# Patient Record
Sex: Female | Born: 1950
Health system: Southern US, Community
[De-identification: ages and names within clinical notes are randomized; demographics above are authoritative.]

## PROBLEM LIST (undated history)

## (undated) DIAGNOSIS — E785 Hyperlipidemia, unspecified: Secondary | ICD-10-CM

## (undated) HISTORY — PX: BUNIONECTOMY: SHX129

## (undated) HISTORY — PX: HAMMER TOE SURGERY: SHX385

## (undated) HISTORY — PX: OTHER SURGICAL HISTORY: SHX169

## (undated) HISTORY — PX: TUBAL LIGATION: SHX77

## (undated) HISTORY — DX: Hyperlipidemia, unspecified: E78.5

---

## 1998-10-26 ENCOUNTER — Other Ambulatory Visit: Admission: RE | Admit: 1998-10-26 | Discharge: 1998-10-26 | Payer: Self-pay | Admitting: Obstetrics and Gynecology

## 1999-12-03 ENCOUNTER — Other Ambulatory Visit: Admission: RE | Admit: 1999-12-03 | Discharge: 1999-12-03 | Payer: Self-pay | Admitting: Obstetrics and Gynecology

## 2001-05-06 ENCOUNTER — Other Ambulatory Visit: Admission: RE | Admit: 2001-05-06 | Discharge: 2001-05-06 | Payer: Self-pay | Admitting: Obstetrics and Gynecology

## 2002-05-17 ENCOUNTER — Other Ambulatory Visit: Admission: RE | Admit: 2002-05-17 | Discharge: 2002-05-17 | Payer: Self-pay | Admitting: Obstetrics and Gynecology

## 2003-07-31 ENCOUNTER — Other Ambulatory Visit: Admission: RE | Admit: 2003-07-31 | Discharge: 2003-07-31 | Payer: Self-pay | Admitting: Obstetrics and Gynecology

## 2004-06-28 ENCOUNTER — Ambulatory Visit (HOSPITAL_COMMUNITY): Admission: RE | Admit: 2004-06-28 | Discharge: 2004-06-28 | Payer: Self-pay | Admitting: Gastroenterology

## 2016-09-01 ENCOUNTER — Ambulatory Visit (HOSPITAL_COMMUNITY)
Admission: EM | Admit: 2016-09-01 | Discharge: 2016-09-01 | Disposition: A | Discharge status: Home / Self Care | Payer: 59 | Attending: Family Medicine | Admitting: Family Medicine

## 2016-09-01 ENCOUNTER — Encounter (HOSPITAL_COMMUNITY): Payer: Self-pay | Admitting: *Deleted

## 2016-09-01 ENCOUNTER — Ambulatory Visit (INDEPENDENT_AMBULATORY_CARE_PROVIDER_SITE_OTHER): Payer: 59

## 2016-09-01 DIAGNOSIS — M25461 Effusion, right knee: Secondary | ICD-10-CM | POA: Insufficient documentation

## 2016-09-01 DIAGNOSIS — M25561 Pain in right knee: Secondary | ICD-10-CM | POA: Diagnosis not present

## 2016-09-01 LAB — SYNOVIAL CELL COUNT + DIFF, W/ CRYSTALS
CRYSTALS FLUID: NONE SEEN
Eosinophils-Synovial: 0 % (ref 0–1)
LYMPHOCYTES-SYNOVIAL FLD: 32 % — AB (ref 0–20)
Monocyte-Macrophage-Synovial Fluid: 43 % — ABNORMAL LOW (ref 50–90)
Neutrophil, Synovial: 25 % (ref 0–25)
WBC, Synovial: 66 /mm3 (ref 0–200)

## 2016-09-01 MED ORDER — TRIAMCINOLONE ACETONIDE 40 MG/ML IJ SUSP
INTRAMUSCULAR | Status: AC
  Filled 2016-09-01: qty 1

## 2016-09-01 MED ORDER — DICLOFENAC SODIUM 75 MG PO TBEC: 75.0000 mg | DELAYED_RELEASE_TABLET | Freq: Two times a day (BID) | ORAL | 0 refills | Status: DC

## 2016-09-01 NOTE — ED Triage Notes (Signed)
Reports kneeling onto a vehicle running board with right knee approx 3 wks ago.  C/O continued pain to right knee.  C/O worsening pain when trying to straighten knee.  Has been wearing right knee brace.  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

## 2016-09-01 NOTE — ED Provider Notes (Signed)
CSN: [id number]     Arrival date & time 09/01/16  1338 History   None    Chief Complaint  Patient presents with  . Knee Pain   (Consider location/radiation/quality/duration/timing/severity/associated sxs/prior Treatment) The history is provided by the patient.  Knee Pain  Location:  Knee Time since incident:  3 weeks Injury: yes   Mechanism of injury: fall   Fall:    Fall occurred: standing position, kneeling down onto the running board of a vehicle.   Impact surface: steel running board.   Point of impact:  Knees   Entrapped after fall: no   Knee location:  R knee Pain details:    Quality:  Aching, dull, pressure and throbbing   Radiates to:  Does not radiate   Severity:  Moderate   Onset quality:  Gradual   Duration:  3 weeks   Timing:  Constant   Progression:  Waxing and waning Chronicity:  New Dislocation: no   Prior injury to area:  No Relieved by:  Compression, immobilization and NSAIDs Worsened by:  Extension, rotation and bearing weight Associated symptoms: decreased ROM, stiffness and swelling   Associated symptoms: no back pain, no fatigue, no fever, no muscle weakness, no neck pain and no tingling   Risk factors: no known bone disorder     History reviewed. No pertinent past medical history. Past Surgical History:  Procedure Laterality Date  . BUNIONECTOMY     x2  . HAMMER TOE SURGERY    . TUBAL LIGATION     History reviewed. No pertinent family history. Social History  Substance Use Topics  . Smoking status: Never Smoker  . Smokeless tobacco: Not on file  . Alcohol use Yes     Comment: 14 drinks/wk - either wine or gin/tonic   OB History    No data available     Review of Systems  Constitutional: Negative for chills, fatigue and fever.  Respiratory: Negative.   Cardiovascular: Negative.   Gastrointestinal: Negative.   Musculoskeletal: Positive for joint swelling and stiffness. Negative for back pain, neck pain and neck stiffness.  Skin:  Negative.   Neurological: Negative.     Allergies  Sulfa antibiotics  Home Medications   Prior to Admission medications   Medication Sig Start Date End Date Taking? Authorizing Provider  ASPIRIN PO Take 81 mg by mouth.   Yes [provider]  CALCIUM PO Take by mouth.   Yes [provider]  Cyanocobalamin (VITAMIN B-12 SL) Place under the tongue.   Yes [provider]  IBUPROFEN PO Take by mouth.   Yes [provider]  Misc Natural Products (TART CHERRY ADVANCED PO) Take by mouth.   Yes [provider]  Multiple Vitamins-Minerals (MULTIVITAMIN PO) Take by mouth.   Yes [provider]  TURMERIC PO Take by mouth.   Yes [provider]  diclofenac (VOLTAREN) 75 MG EC tablet Take 1 tablet (75 mg total) by mouth 2 (two) times daily. 09/01/16   Dorena BodoKennard, Ronnell Makarewicz, NP   Meds Ordered and Administered this Visit  Medications - No data to display  BP 119/83   Pulse (!) 58   Temp 98.4 F (36.9 C) (Oral)   Resp 16   SpO2 98%  No data found.   Physical Exam  Constitutional: She is oriented to person, place, and time. She appears well-developed and well-nourished. No distress.  HENT:  Head: Normocephalic and atraumatic.  Right Ear: External ear normal.  Left Ear: External ear normal.  Eyes: Conjunctivae are normal.  Neck: Normal range of motion.  Cardiovascular: Normal rate and regular rhythm.   Pulmonary/Chest: Effort normal and breath sounds normal.  Musculoskeletal:  Tenderness along the MCL, and noted effusion within the joint, with ballottement of the patella, and positive wave sign through the knee.  Neurological: She is alert and oriented to person, place, and time.  Skin: Skin is warm and dry. Capillary refill takes less than 2 seconds. No rash noted. She is not diaphoretic. No erythema.  Psychiatric: She has a normal mood and affect. Her behavior is normal.  Nursing note and vitals reviewed.   Urgent Care Course      .Joint Aspiration/Arthrocentesis Date/Time: 09/01/2016 4:07 PM Performed by: Dorena Bodo Authorized by: Elvina Sidle   Consent:    Consent obtained:  Verbal   Consent given by:  Patient   Risks discussed:  Bleeding, infection and pain Location:    Location:  Knee   Knee:  R knee Anesthesia (see MAR for exact dosages):    Anesthesia method:  Local infiltration   Local anesthetic:  Lidocaine 2% w/o epi Procedure details:    Needle gauge:  18 G   Ultrasound guidance: no     Approach:  Medial   Aspirate amount:  10 cc   Aspirate characteristics:  Yellow and clear   Steroid injected: yes     Specimen collected: yes   Post-procedure details:    Dressing:  Adhesive bandage   Patient tolerance of procedure:  Tolerated well, no immediate complications Comments:     Effusion drained, steroid and lidocaine injected for pain relief    (including critical care time)  Labs Review Labs Reviewed  SYNOVIAL CELL COUNT + DIFF, W/ CRYSTALS    Imaging Review Dg Knee Complete 4 Views Right  Result Date: 09/01/2016 CLINICAL DATA:  Larey Seat trying to get into car yesterday, knee hit floor board very hard, pain around RIGHT knee CT EXAM: RIGHT KNEE - COMPLETE 4+ VIEW COMPARISON:  None FINDINGS: Mild osseous demineralization. Minimal scattered joint space narrowing. Small knee joint effusion. No acute fracture, dislocation, or bone destruction. IMPRESSION: Mild degenerative changes and small knee joint effusion without acute bony abnormalities. Electronically Signed   By: Ulyses Southward M.D.   On: 09/01/2016 15:21         MDM   1. Effusion of right knee    Joint effusion drained in clinic, sample sent to lab for evaluation. Injection of lidocaine and Kenalog performed for pain relief. Counseling provided on aftercare. Follow up with primary care provider as needed.    Dorena Bodo, NP 09/01/16 1610

## 2016-09-01 NOTE — Discharge Instructions (Signed)
Fluid has been drained from your knee and an injection of lidocaine and kenalog has been placed into your knee. I recommend continued wearing of your knee brace for support, rest, ice, elevation and compression through either your brace or Ace wraps. I have also added antiinflammatories as well, take 1 tablet twice daily as needed. Follow up with your primary care provider as needed.

## 2017-11-26 DIAGNOSIS — E559 Vitamin D deficiency, unspecified: Secondary | ICD-10-CM | POA: Diagnosis not present

## 2017-12-25 DIAGNOSIS — E7800 Pure hypercholesterolemia, unspecified: Secondary | ICD-10-CM | POA: Diagnosis not present

## 2017-12-28 DIAGNOSIS — Z0100 Encounter for examination of eyes and vision without abnormal findings: Secondary | ICD-10-CM | POA: Diagnosis not present

## 2018-02-05 DIAGNOSIS — Z0000 Encounter for general adult medical examination without abnormal findings: Secondary | ICD-10-CM | POA: Diagnosis not present

## 2018-02-05 DIAGNOSIS — R9431 Abnormal electrocardiogram [ECG] [EKG]: Secondary | ICD-10-CM | POA: Diagnosis not present

## 2018-02-05 DIAGNOSIS — Z131 Encounter for screening for diabetes mellitus: Secondary | ICD-10-CM | POA: Diagnosis not present

## 2018-02-05 DIAGNOSIS — E7800 Pure hypercholesterolemia, unspecified: Secondary | ICD-10-CM | POA: Diagnosis not present

## 2018-02-05 DIAGNOSIS — E669 Obesity, unspecified: Secondary | ICD-10-CM | POA: Diagnosis not present

## 2018-02-05 DIAGNOSIS — Z23 Encounter for immunization: Secondary | ICD-10-CM | POA: Diagnosis not present

## 2018-02-05 DIAGNOSIS — Z1159 Encounter for screening for other viral diseases: Secondary | ICD-10-CM | POA: Diagnosis not present

## 2018-02-09 ENCOUNTER — Telehealth: Payer: Self-pay

## 2018-02-09 NOTE — Telephone Encounter (Signed)
SENT REFERRAL TO SCHEDULING AND FILED NOTES 

## 2018-02-24 DIAGNOSIS — Z23 Encounter for immunization: Secondary | ICD-10-CM | POA: Diagnosis not present

## 2018-03-19 ENCOUNTER — Ambulatory Visit: Payer: 59 | Admitting: Cardiology

## 2018-03-25 NOTE — Progress Notes (Signed)
Cardiology Office Note   Date:  04/05/2018   ID:  Susan IvanoffMiralee Heyboer, DOB 1950-06-30, MRN [id number]  PCP:  Joycelyn RuaMeyers, Stephen, MD  Cardiologist:   Taedyn Glasscock [place], MD   Chief Complaint  Patient presents with  . Follow-up    abnormal Ecg      History of Present Illness: Susan Alvarez is a 67 y.o. female who is seen at the request of Dr. Lenise ArenaMeyers for evaluation of abnormal Ecg. She is a Consulting civil engineerclinical research nurse. She is generally in excellent health. She does have a history of elevated cholesterol. On recent exam her Ecg was felt to abnormal compared to prior and the computer interpreted this as possible old infarct. She has no history of vascular disease. No history of HTN, tobacco use, DM, or family history of CAD. She is very active and exercises regularly. She denies any chest pain, dyspnea, palpitations, nausea or fatigue. She is currently taking Slo niacin and red yeast rice for her cholesterol.     Past Medical History:  Diagnosis Date  . Hyperlipidemia     Past Surgical History:  Procedure Laterality Date  . arthroscopic knee surgery    . BUNIONECTOMY     x2  . HAMMER TOE SURGERY    . TUBAL LIGATION       Current Outpatient Medications  Medication Sig Dispense Refill  . APPLE CIDER VINEGAR PO Take by mouth daily.    . ASPIRIN PO Take 81 mg by mouth.    Marland Kitchen. CALCIUM PO Take by mouth.    . Cyanocobalamin (VITAMIN B-12 SL) Place under the tongue.    . Misc Natural Products (TART CHERRY ADVANCED PO) Take by mouth.    . Multiple Vitamins-Minerals (MULTIVITAMIN PO) Take by mouth.    . Red Yeast Rice Extract (RED YEAST RICE PO) Take by mouth daily.    . TURMERIC PO Take by mouth.     No current facility-administered medications for this visit.     Allergies:   Sulfa antibiotics    Social History:  The patient  reports that she has never smoked. She has never used smokeless tobacco. She reports current alcohol use. She reports that she does not use drugs.   Family  History:  The patient's family history includes Dementia in her mother; Lung cancer in her father; Uterine cancer in her mother.    ROS:  Please see the history of present illness.   Otherwise, review of systems are positive for none.   All other systems are reviewed and negative.    PHYSICAL EXAM: VS:  BP 132/74 (BP Location: Left Arm)   Pulse 90   Ht 5\' 4"  (1.626 m)   Wt 201 lb 6.4 oz (91.4 kg)   BMI 34.57 kg/m  , BMI Body mass index is 34.57 kg/m. GEN: Well nourished, overweight, in no acute distress  HEENT: normal  Neck: no JVD, carotid bruits, or masses Cardiac: RRR; no murmurs, rubs, or gallops,no edema  Respiratory:  clear to auscultation bilaterally, normal work of breathing GI: soft, nontender, nondistended, + BS MS: no deformity or atrophy  Skin: warm and dry, no rash Neuro:  Strength and sensation are intact Psych: euthymic mood, full affect   EKG:  EKG is ordered today. The ekg ordered today demonstrates NSR with a normal Ecg. I have personally reviewed and interpreted this study.    Recent Labs: No results found for requested labs within last 8760 hours.    Lipid Panel No results found for:  CHOL, TRIG, HDL, CHOLHDL, VLDL, LDLCALC, LDLDIRECT    Wt Readings from Last 3 Encounters:  04/05/18 201 lb 6.4 oz (91.4 kg)      Other studies Reviewed: Additional studies/ records that were reviewed today include:  Labs dated 10/12/17: chemistries and TSH normal Dated 02/05/18: cholesterol 222, triglycerides 76, HDL 65, LDL 141.   ASSESSMENT AND PLAN:  1.  ? Abnormal Ecg. Ecg today is normal. I suspect this may have been related to lead placement with more of a QS complex in V2 but will need to obtain prior tracings to review. No further work up needed. She is asymptomatic. I recommend she stop taking ASA for primary prevention. 2. Hypercholesterolemia. Continue to focus on lifestyle modification, weight loss and exercise.    Current medicines are reviewed at  length with the patient today.  The patient does not have concerns regarding medicines.  The following changes have been made:  Stop AsA  Labs/ tests ordered today include:  No orders of the defined types were placed in this encounter.    Disposition:   FU with me PRN   Signed, Calianne Larue [place], MD  04/05/2018 9:20 AM    [facility] [address], [place], [place], [address] Phone [phone], Fax [phone]

## 2018-03-29 IMAGING — DX DG KNEE COMPLETE 4+V*R*
5 series · 5 of 5 positions shown · non-contrast
Comparison: None

CLINICAL DATA: Fell trying to get into car yesterday, knee hit
floor board very hard, pain around RIGHT knee CT

EXAM:
RIGHT KNEE - COMPLETE 4+ VIEW

[knee ap]
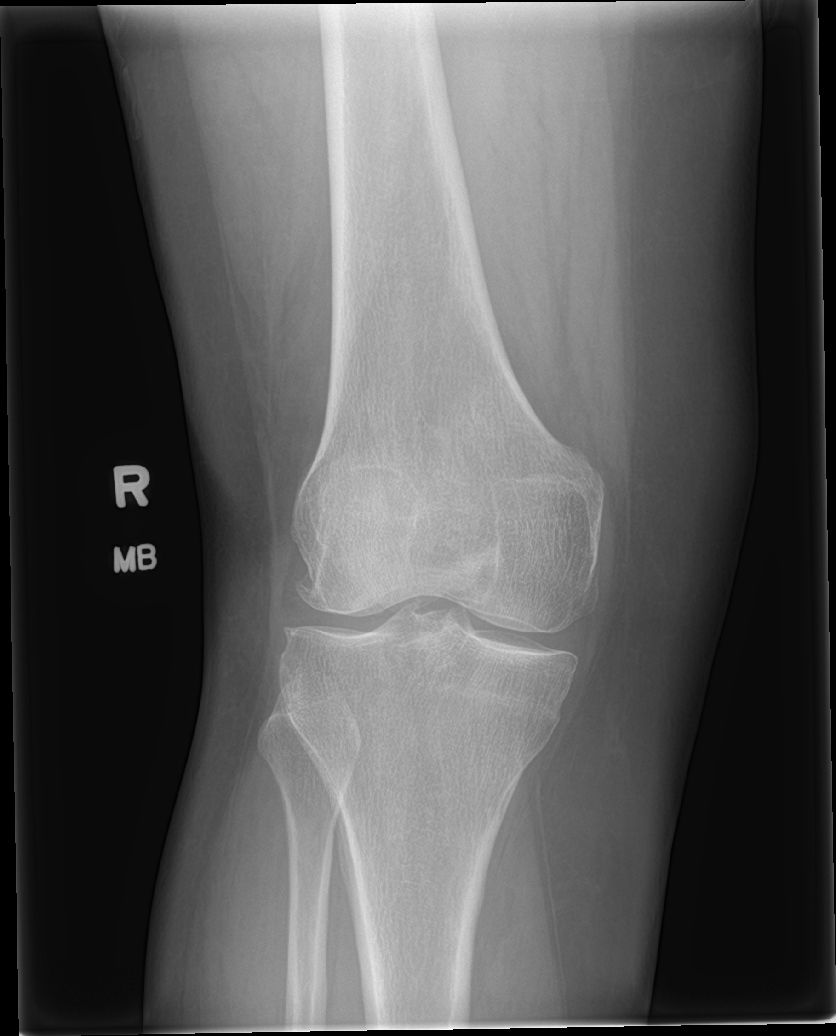

[knee obl (1 of 2)]
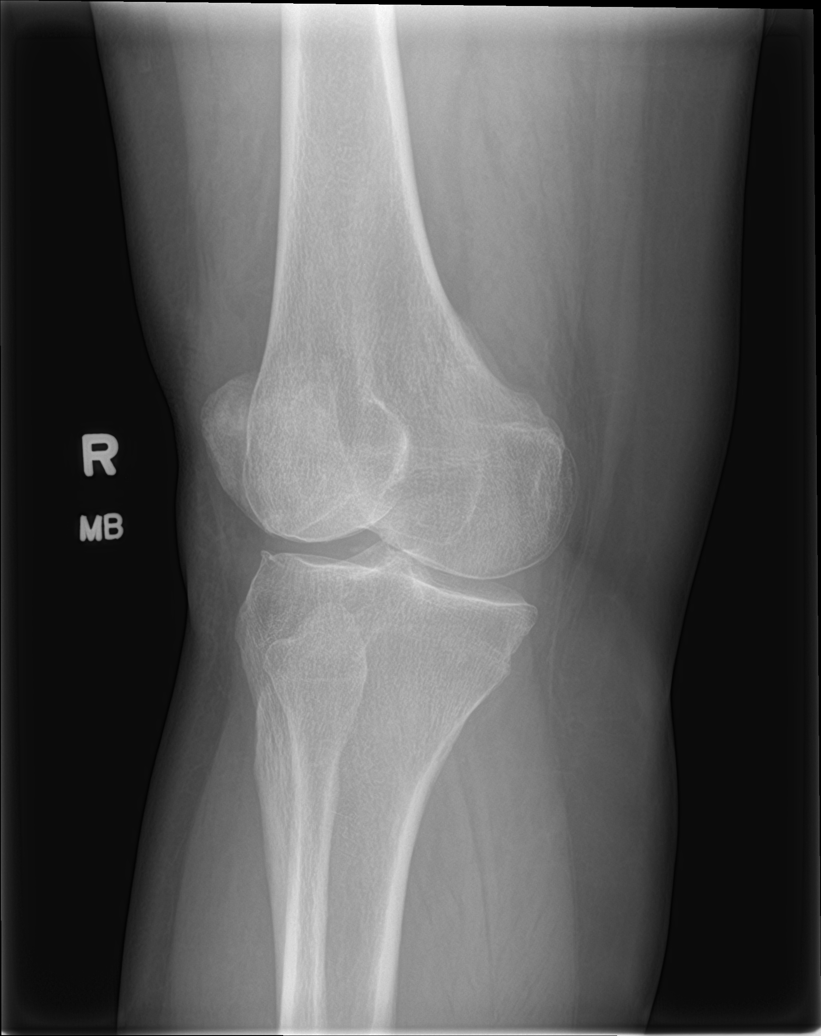

[knee obl (2 of 2)]
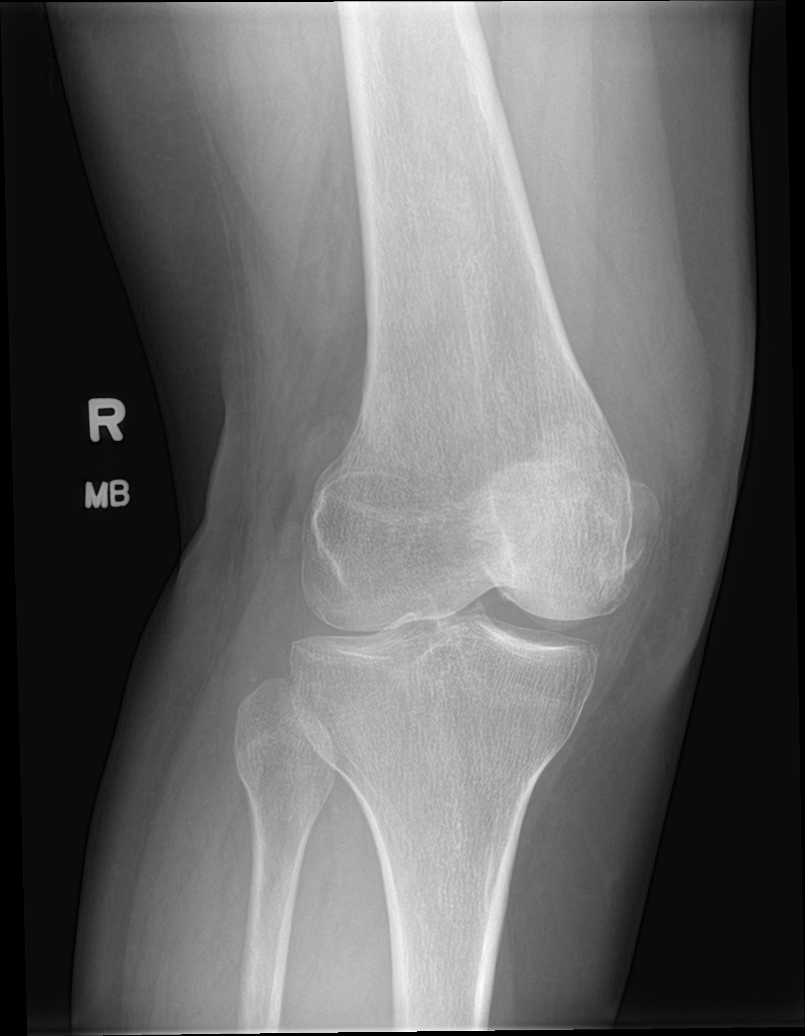

[knee lat]
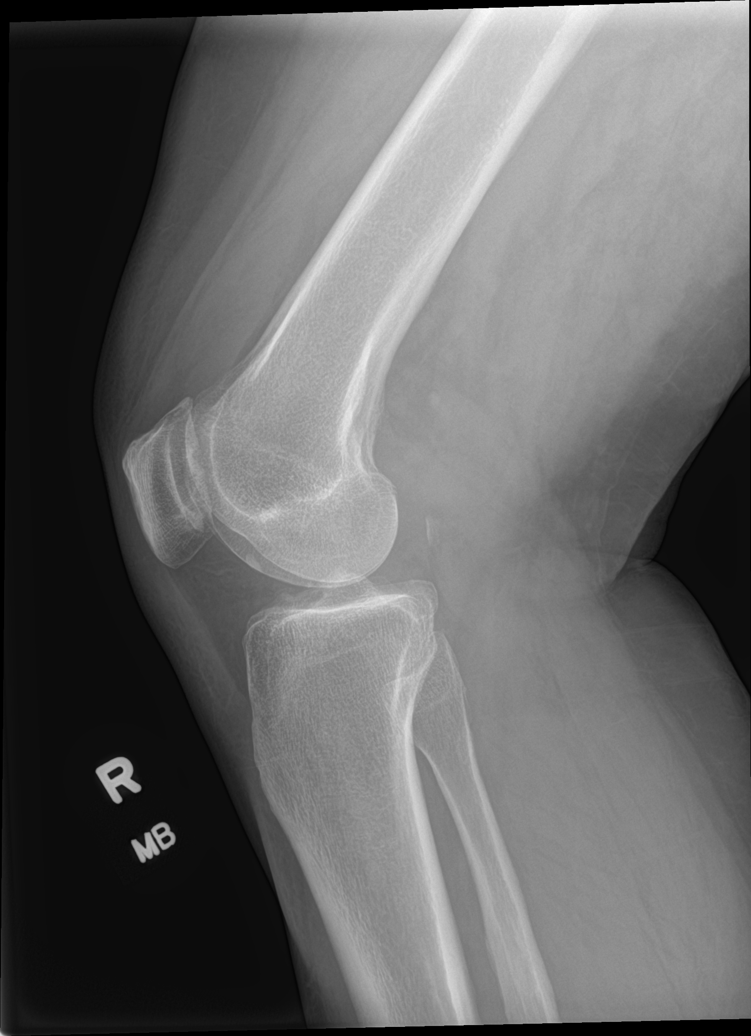

[knee sunrise]
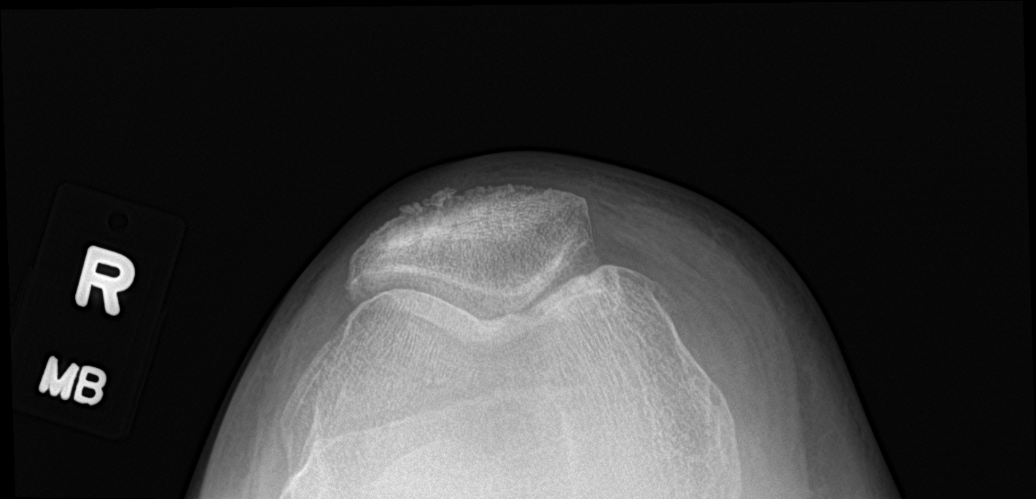

[5 of 5 positions shown; findings below may reference images not displayed]

FINDINGS: Mild osseous demineralization.

Minimal scattered joint space narrowing.

Small knee joint effusion.

No acute fracture, dislocation, or bone destruction.
IMPRESSION: Mild degenerative changes and small knee joint effusion without
acute bony abnormalities.

## 2018-04-02 ENCOUNTER — Telehealth: Payer: Self-pay

## 2018-04-02 NOTE — Telephone Encounter (Signed)
FAXED TO [facility]

## 2018-04-05 ENCOUNTER — Ambulatory Visit: Payer: Medicare HMO | Admitting: Cardiology

## 2018-04-05 ENCOUNTER — Encounter: Payer: Self-pay | Admitting: Cardiology

## 2018-04-05 ENCOUNTER — Encounter

## 2018-04-05 VITALS — BP 132/74 | HR 90 | Ht 64.0 in | Wt 201.4 lb

## 2018-04-05 DIAGNOSIS — R9431 Abnormal electrocardiogram [ECG] [EKG]: Secondary | ICD-10-CM

## 2018-04-05 NOTE — Patient Instructions (Signed)
Stop taking ASA 

## 2018-05-31 DIAGNOSIS — Z0100 Encounter for examination of eyes and vision without abnormal findings: Secondary | ICD-10-CM | POA: Diagnosis not present

## 2018-05-31 DIAGNOSIS — H52223 Regular astigmatism, bilateral: Secondary | ICD-10-CM | POA: Diagnosis not present

## 2018-05-31 DIAGNOSIS — H524 Presbyopia: Secondary | ICD-10-CM | POA: Diagnosis not present

## 2018-05-31 DIAGNOSIS — H35371 Puckering of macula, right eye: Secondary | ICD-10-CM | POA: Diagnosis not present

## 2018-05-31 DIAGNOSIS — H5213 Myopia, bilateral: Secondary | ICD-10-CM | POA: Diagnosis not present

## 2018-08-12 DIAGNOSIS — Z20828 Contact with and (suspected) exposure to other viral communicable diseases: Secondary | ICD-10-CM | POA: Diagnosis not present

## 2018-10-25 DIAGNOSIS — Z1211 Encounter for screening for malignant neoplasm of colon: Secondary | ICD-10-CM | POA: Diagnosis not present

## 2018-10-25 DIAGNOSIS — Z6835 Body mass index (BMI) 35.0-35.9, adult: Secondary | ICD-10-CM | POA: Diagnosis not present

## 2018-10-25 DIAGNOSIS — Z01419 Encounter for gynecological examination (general) (routine) without abnormal findings: Secondary | ICD-10-CM | POA: Diagnosis not present

## 2018-11-29 DIAGNOSIS — Z1231 Encounter for screening mammogram for malignant neoplasm of breast: Secondary | ICD-10-CM | POA: Diagnosis not present

## 2019-01-16 DIAGNOSIS — R69 Illness, unspecified: Secondary | ICD-10-CM | POA: Diagnosis not present

## 2019-02-07 DIAGNOSIS — Z23 Encounter for immunization: Secondary | ICD-10-CM | POA: Diagnosis not present

## 2019-02-07 DIAGNOSIS — E559 Vitamin D deficiency, unspecified: Secondary | ICD-10-CM | POA: Diagnosis not present

## 2019-02-07 DIAGNOSIS — Z0000 Encounter for general adult medical examination without abnormal findings: Secondary | ICD-10-CM | POA: Diagnosis not present

## 2019-02-07 DIAGNOSIS — E7800 Pure hypercholesterolemia, unspecified: Secondary | ICD-10-CM | POA: Diagnosis not present

## 2019-02-18 DIAGNOSIS — Z1159 Encounter for screening for other viral diseases: Secondary | ICD-10-CM | POA: Diagnosis not present

## 2019-02-23 DIAGNOSIS — K635 Polyp of colon: Secondary | ICD-10-CM | POA: Diagnosis not present

## 2019-02-23 DIAGNOSIS — Z1211 Encounter for screening for malignant neoplasm of colon: Secondary | ICD-10-CM | POA: Diagnosis not present

## 2019-02-23 DIAGNOSIS — K5730 Diverticulosis of large intestine without perforation or abscess without bleeding: Secondary | ICD-10-CM | POA: Diagnosis not present

## 2019-02-23 DIAGNOSIS — Z800 Family history of malignant neoplasm of digestive organs: Secondary | ICD-10-CM | POA: Diagnosis not present

## 2019-02-25 DIAGNOSIS — K635 Polyp of colon: Secondary | ICD-10-CM | POA: Diagnosis not present

## 2019-06-15 DIAGNOSIS — Z20828 Contact with and (suspected) exposure to other viral communicable diseases: Secondary | ICD-10-CM | POA: Diagnosis not present

## 2019-11-02 DIAGNOSIS — Z6835 Body mass index (BMI) 35.0-35.9, adult: Secondary | ICD-10-CM | POA: Diagnosis not present

## 2019-11-02 DIAGNOSIS — Z85828 Personal history of other malignant neoplasm of skin: Secondary | ICD-10-CM | POA: Diagnosis not present

## 2019-11-02 DIAGNOSIS — I951 Orthostatic hypotension: Secondary | ICD-10-CM | POA: Diagnosis not present

## 2019-11-02 DIAGNOSIS — Z809 Family history of malignant neoplasm, unspecified: Secondary | ICD-10-CM | POA: Diagnosis not present

## 2019-11-02 DIAGNOSIS — M1990 Unspecified osteoarthritis, unspecified site: Secondary | ICD-10-CM | POA: Diagnosis not present

## 2019-11-02 DIAGNOSIS — Z882 Allergy status to sulfonamides status: Secondary | ICD-10-CM | POA: Diagnosis not present

## 2020-02-01 DIAGNOSIS — R69 Illness, unspecified: Secondary | ICD-10-CM | POA: Diagnosis not present

## 2020-03-27 DIAGNOSIS — Z23 Encounter for immunization: Secondary | ICD-10-CM | POA: Diagnosis not present

## 2020-03-27 DIAGNOSIS — E7800 Pure hypercholesterolemia, unspecified: Secondary | ICD-10-CM | POA: Diagnosis not present

## 2020-03-27 DIAGNOSIS — Z0000 Encounter for general adult medical examination without abnormal findings: Secondary | ICD-10-CM | POA: Diagnosis not present

## 2020-05-01 DIAGNOSIS — E785 Hyperlipidemia, unspecified: Secondary | ICD-10-CM | POA: Diagnosis not present

## 2020-06-27 DIAGNOSIS — Z20822 Contact with and (suspected) exposure to covid-19: Secondary | ICD-10-CM | POA: Diagnosis not present

## 2020-10-12 DIAGNOSIS — H2513 Age-related nuclear cataract, bilateral: Secondary | ICD-10-CM | POA: Diagnosis not present

## 2020-10-12 DIAGNOSIS — H35371 Puckering of macula, right eye: Secondary | ICD-10-CM | POA: Diagnosis not present

## 2020-10-12 DIAGNOSIS — H5213 Myopia, bilateral: Secondary | ICD-10-CM | POA: Diagnosis not present

## 2020-10-12 DIAGNOSIS — Z0100 Encounter for examination of eyes and vision without abnormal findings: Secondary | ICD-10-CM | POA: Diagnosis not present

## 2020-12-13 DIAGNOSIS — Z20822 Contact with and (suspected) exposure to covid-19: Secondary | ICD-10-CM | POA: Diagnosis not present

## 2021-04-11 DIAGNOSIS — Z0000 Encounter for general adult medical examination without abnormal findings: Secondary | ICD-10-CM | POA: Diagnosis not present

## 2021-04-11 DIAGNOSIS — Z131 Encounter for screening for diabetes mellitus: Secondary | ICD-10-CM | POA: Diagnosis not present

## 2021-04-11 DIAGNOSIS — E785 Hyperlipidemia, unspecified: Secondary | ICD-10-CM | POA: Diagnosis not present

## 2021-04-11 DIAGNOSIS — Z23 Encounter for immunization: Secondary | ICD-10-CM | POA: Diagnosis not present

## 2021-04-11 DIAGNOSIS — E559 Vitamin D deficiency, unspecified: Secondary | ICD-10-CM | POA: Diagnosis not present

## 2021-11-28 DIAGNOSIS — Z6838 Body mass index (BMI) 38.0-38.9, adult: Secondary | ICD-10-CM | POA: Diagnosis not present

## 2021-11-28 DIAGNOSIS — Z1231 Encounter for screening mammogram for malignant neoplasm of breast: Secondary | ICD-10-CM | POA: Diagnosis not present

## 2021-11-28 DIAGNOSIS — Z01419 Encounter for gynecological examination (general) (routine) without abnormal findings: Secondary | ICD-10-CM | POA: Diagnosis not present

## 2021-12-31 DIAGNOSIS — J069 Acute upper respiratory infection, unspecified: Secondary | ICD-10-CM | POA: Diagnosis not present

## 2021-12-31 DIAGNOSIS — Z23 Encounter for immunization: Secondary | ICD-10-CM | POA: Diagnosis not present

## 2022-01-04 DIAGNOSIS — Z0130 Encounter for examination of blood pressure without abnormal findings: Secondary | ICD-10-CM | POA: Diagnosis not present

## 2022-01-04 DIAGNOSIS — Z6837 Body mass index (BMI) 37.0-37.9, adult: Secondary | ICD-10-CM | POA: Diagnosis not present

## 2022-01-04 DIAGNOSIS — T3695XA Adverse effect of unspecified systemic antibiotic, initial encounter: Secondary | ICD-10-CM | POA: Diagnosis not present

## 2022-01-04 DIAGNOSIS — B379 Candidiasis, unspecified: Secondary | ICD-10-CM | POA: Diagnosis not present

## 2022-01-04 DIAGNOSIS — R300 Dysuria: Secondary | ICD-10-CM | POA: Diagnosis not present

## 2022-01-04 DIAGNOSIS — R030 Elevated blood-pressure reading, without diagnosis of hypertension: Secondary | ICD-10-CM | POA: Diagnosis not present

## 2022-01-24 DIAGNOSIS — R300 Dysuria: Secondary | ICD-10-CM | POA: Diagnosis not present

## 2022-01-31 DIAGNOSIS — Z0100 Encounter for examination of eyes and vision without abnormal findings: Secondary | ICD-10-CM | POA: Diagnosis not present

## 2022-02-24 DIAGNOSIS — R300 Dysuria: Secondary | ICD-10-CM | POA: Diagnosis not present

## 2022-04-28 DIAGNOSIS — B3731 Acute candidiasis of vulva and vagina: Secondary | ICD-10-CM | POA: Diagnosis not present

## 2022-04-28 DIAGNOSIS — R300 Dysuria: Secondary | ICD-10-CM | POA: Diagnosis not present

## 2022-05-01 DIAGNOSIS — H52223 Regular astigmatism, bilateral: Secondary | ICD-10-CM | POA: Diagnosis not present

## 2022-05-01 DIAGNOSIS — H353191 Nonexudative age-related macular degeneration, unspecified eye, early dry stage: Secondary | ICD-10-CM | POA: Diagnosis not present

## 2022-05-01 DIAGNOSIS — H524 Presbyopia: Secondary | ICD-10-CM | POA: Diagnosis not present

## 2022-05-01 DIAGNOSIS — H5213 Myopia, bilateral: Secondary | ICD-10-CM | POA: Diagnosis not present

## 2022-05-01 DIAGNOSIS — H35371 Puckering of macula, right eye: Secondary | ICD-10-CM | POA: Diagnosis not present

## 2022-08-15 DIAGNOSIS — Z0000 Encounter for general adult medical examination without abnormal findings: Secondary | ICD-10-CM | POA: Diagnosis not present

## 2022-08-15 DIAGNOSIS — E785 Hyperlipidemia, unspecified: Secondary | ICD-10-CM | POA: Diagnosis not present

## 2022-08-15 DIAGNOSIS — Z86010 Personal history of colonic polyps: Secondary | ICD-10-CM | POA: Diagnosis not present

## 2022-08-15 DIAGNOSIS — Z1231 Encounter for screening mammogram for malignant neoplasm of breast: Secondary | ICD-10-CM | POA: Diagnosis not present

## 2022-09-09 DIAGNOSIS — H18413 Arcus senilis, bilateral: Secondary | ICD-10-CM | POA: Diagnosis not present

## 2022-09-09 DIAGNOSIS — H25043 Posterior subcapsular polar age-related cataract, bilateral: Secondary | ICD-10-CM | POA: Diagnosis not present

## 2022-09-09 DIAGNOSIS — H35371 Puckering of macula, right eye: Secondary | ICD-10-CM | POA: Diagnosis not present

## 2022-09-09 DIAGNOSIS — H2513 Age-related nuclear cataract, bilateral: Secondary | ICD-10-CM | POA: Diagnosis not present

## 2022-11-26 DIAGNOSIS — H02836 Dermatochalasis of left eye, unspecified eyelid: Secondary | ICD-10-CM | POA: Diagnosis not present

## 2022-11-26 DIAGNOSIS — H35371 Puckering of macula, right eye: Secondary | ICD-10-CM | POA: Diagnosis not present

## 2022-11-26 DIAGNOSIS — H2513 Age-related nuclear cataract, bilateral: Secondary | ICD-10-CM | POA: Diagnosis not present

## 2022-11-26 DIAGNOSIS — H02833 Dermatochalasis of right eye, unspecified eyelid: Secondary | ICD-10-CM | POA: Diagnosis not present

## 2022-12-03 DIAGNOSIS — Z1231 Encounter for screening mammogram for malignant neoplasm of breast: Secondary | ICD-10-CM | POA: Diagnosis not present

## 2022-12-03 DIAGNOSIS — N958 Other specified menopausal and perimenopausal disorders: Secondary | ICD-10-CM | POA: Diagnosis not present

## 2022-12-03 DIAGNOSIS — Z01419 Encounter for gynecological examination (general) (routine) without abnormal findings: Secondary | ICD-10-CM | POA: Diagnosis not present

## 2022-12-03 DIAGNOSIS — Z6838 Body mass index (BMI) 38.0-38.9, adult: Secondary | ICD-10-CM | POA: Diagnosis not present

## 2022-12-29 DIAGNOSIS — H52209 Unspecified astigmatism, unspecified eye: Secondary | ICD-10-CM | POA: Diagnosis not present

## 2022-12-29 DIAGNOSIS — H2511 Age-related nuclear cataract, right eye: Secondary | ICD-10-CM | POA: Diagnosis not present

## 2022-12-30 DIAGNOSIS — H2512 Age-related nuclear cataract, left eye: Secondary | ICD-10-CM | POA: Diagnosis not present

## 2023-01-12 DIAGNOSIS — H52209 Unspecified astigmatism, unspecified eye: Secondary | ICD-10-CM | POA: Diagnosis not present

## 2023-01-12 DIAGNOSIS — H2512 Age-related nuclear cataract, left eye: Secondary | ICD-10-CM | POA: Diagnosis not present

## 2023-01-21 DIAGNOSIS — Z9189 Other specified personal risk factors, not elsewhere classified: Secondary | ICD-10-CM | POA: Diagnosis not present

## 2023-01-21 DIAGNOSIS — Z809 Family history of malignant neoplasm, unspecified: Secondary | ICD-10-CM | POA: Diagnosis not present

## 2023-08-10 DIAGNOSIS — J209 Acute bronchitis, unspecified: Secondary | ICD-10-CM | POA: Diagnosis not present

## 2023-08-10 DIAGNOSIS — R059 Cough, unspecified: Secondary | ICD-10-CM | POA: Diagnosis not present

## 2023-10-26 DIAGNOSIS — E785 Hyperlipidemia, unspecified: Secondary | ICD-10-CM | POA: Diagnosis not present

## 2023-10-31 DIAGNOSIS — S61201A Unspecified open wound of left index finger without damage to nail, initial encounter: Secondary | ICD-10-CM | POA: Diagnosis not present

## 2024-03-30 DIAGNOSIS — R059 Cough, unspecified: Secondary | ICD-10-CM | POA: Diagnosis not present

## 2024-03-30 DIAGNOSIS — J069 Acute upper respiratory infection, unspecified: Secondary | ICD-10-CM | POA: Diagnosis not present

## 2024-03-30 DIAGNOSIS — Z20822 Contact with and (suspected) exposure to covid-19: Secondary | ICD-10-CM | POA: Diagnosis not present

## 2024-03-30 DIAGNOSIS — J019 Acute sinusitis, unspecified: Secondary | ICD-10-CM | POA: Diagnosis not present
